# Patient Record
Sex: Male | Born: 1990
Health system: Southern US, Community
[De-identification: ages and names within clinical notes are randomized; demographics above are authoritative.]

---

## 2020-04-05 ENCOUNTER — Other Ambulatory Visit (HOSPITAL_COMMUNITY): Payer: Self-pay | Admitting: Family Medicine

## 2020-04-05 ENCOUNTER — Ambulatory Visit (INDEPENDENT_AMBULATORY_CARE_PROVIDER_SITE_OTHER): Payer: Medicaid Other | Admitting: Family Medicine

## 2020-04-05 ENCOUNTER — Other Ambulatory Visit: Payer: Self-pay

## 2020-04-05 ENCOUNTER — Other Ambulatory Visit (HOSPITAL_COMMUNITY)
Admission: RE | Admit: 2020-04-05 | Discharge: 2020-04-05 | Disposition: A | Discharge status: Home / Self Care | Payer: Medicaid Other | Source: Ambulatory Visit | Attending: Family Medicine | Admitting: Family Medicine

## 2020-04-05 VITALS — BP 110/68 | HR 64 | Ht 70.08 in | Wt 165.2 lb

## 2020-04-05 DIAGNOSIS — B354 Tinea corporis: Secondary | ICD-10-CM | POA: Diagnosis not present

## 2020-04-05 DIAGNOSIS — R7612 Nonspecific reaction to cell mediated immunity measurement of gamma interferon antigen response without active tuberculosis: Secondary | ICD-10-CM | POA: Insufficient documentation

## 2020-04-05 DIAGNOSIS — Z0120 Encounter for dental examination and cleaning without abnormal findings: Secondary | ICD-10-CM | POA: Insufficient documentation

## 2020-04-05 DIAGNOSIS — Z0289 Encounter for other administrative examinations: Secondary | ICD-10-CM

## 2020-04-05 LAB — POCT UA - MICROSCOPIC ONLY

## 2020-04-05 LAB — POCT URINALYSIS DIP (MANUAL ENTRY)
Bilirubin, UA: NEGATIVE
Blood, UA: NEGATIVE
Glucose, UA: NEGATIVE mg/dL
Ketones, POC UA: NEGATIVE mg/dL
Leukocytes, UA: NEGATIVE
Nitrite, UA: NEGATIVE
Protein Ur, POC: NEGATIVE mg/dL
Spec Grav, UA: 1.025 (ref 1.010–1.025)
Urobilinogen, UA: 0.2 E.U./dL
pH, UA: 7 (ref 5.0–8.0)

## 2020-04-05 MED ORDER — IVERMECTIN 3 MG PO TABS: 200.0000 ug/kg | ORAL_TABLET | Freq: Every day | ORAL | 0 refills | Status: AC

## 2020-04-05 MED ORDER — KETOCONAZOLE 2 % EX CREA: 1.0000 "application " | TOPICAL_CREAM | Freq: Every day | CUTANEOUS | 0 refills | Status: DC

## 2020-04-05 MED ORDER — ALBENDAZOLE 200 MG PO TABS: 400.0000 mg | ORAL_TABLET | Freq: Once | ORAL | 0 refills | Status: AC

## 2020-04-05 MED FILL — ALBENDAZOLE 200 MG TABS: 200 | 1 days supply | Qty: 2 | Fill #0

## 2020-04-05 MED FILL — KETOCONAZOLE 2% CREAM: 2 | 7 days supply | Qty: 15 | Fill #0

## 2020-04-05 MED FILL — IVERMECTIN 3 MG TABLET: 3 | 2 days supply | Qty: 10 | Fill #0

## 2020-04-05 NOTE — Assessment & Plan Note (Signed)
Provided list of dentists currently accepting Medicaid and new patients.
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
## 2020-04-05 NOTE — Patient Instructions (Addendum)
Unfortunately, we do not yet have written translation services here. I have translated this paperwork into your language using Google translate. There may be some errors.   It was wonderful to meet you today. Thank you for allowing me to be a part of your care. Below is a short summary of what we discussed at your visit today:     ?      ??   .         ?? ??    ?? ?. ??  ??? ?? .   ?? ?  ?  ?   ?.  ?  ? ?   ?  . ?   ? ?? ? ? ?    ? ?  ?:  Establishing as a patient at the [facility] Today we reviewed all of your health history, including past medical conditions, medications, past surgeries, and allergies.  We also reviewed your vaccine status and necessary screenings, those are discussed below.    ? ?  ?? ?    ? ?   ? ?  ? ??? ? ? ?    ?  ?    ? ?  ?. ?   ? ?  ?? ???  ???  ?  .  Blood work Today we obtained blood samples from you for routine immigration blood tests. We will test for certain infectious diseases along with evaluating your cholesterol, thyroid function, and other health parameters. If the results are normal or negative, we will send you a letter. If any tests are positive or abnormal, we will call you with an interpreter to let you know the results and plan.     ??   ? ?   ??  ?? ??   ?  ?? ?  ??. ?    ??  ??? ?   ??? ???    ???  ?  ? ?.  ??  ?   ?    ? ? .  ? ?? ? ? ?   ?     ?  ?  ?     ?    ?  ?.  Screenings PAP Smear: This test is recommended for all adult women to screen for cervical cancer. I have included a handout on what is involved with this exam. When you are ready for this screening test, please call the office and schedule it. This is not urgent and may be scheduled at your convenience.   Mammograms: As you are 30 y.o., you do not yet need a mammogram to screen for breast cancer. These will start when you are 30 years old.   Colonoscopy: As you are 30 y.o. and without family history of colon cancer, you will need to start colon cancer screenings with colonoscopies at 30 years of age.    ??? PAP Smear:  ?  ?  ?  ?? ?? ?      ? ?.  ?   ?? ? ? ? ? ? ? ? .  ?   ? ??? ??   ?  ?   ?   ? ?? ?.  ???       ?  ? .  ??:  ?? ?  29  ?    ??    ?  ??  ?? .     ?   ?  46  ?.  :  ?? ?  29  ?.      ? ?   ??  ?  56 ? ?       ? ? ?.  If you have any questions or concerns, please do not hesitate to contact us via phone or MyChart message.       ? ? ??   ?  ? ? MyChart ?  ? ?  ?? ?.  Fayette Pho, MD

## 2020-04-07 LAB — URINE CYTOLOGY ANCILLARY ONLY
Chlamydia: NEGATIVE
Comment: NEGATIVE
Comment: NORMAL
Neisseria Gonorrhea: NEGATIVE

## 2020-04-09 LAB — CBC WITH DIFFERENTIAL/PLATELET
Basophils Absolute: 0 10*3/uL (ref 0.0–0.2)
Basos: 1 %
EOS (ABSOLUTE): 0.1 10*3/uL (ref 0.0–0.4)
Eos: 2 %
Hematocrit: 44.6 % (ref 37.5–51.0)
Hemoglobin: 15.2 g/dL (ref 13.0–17.7)
Immature Grans (Abs): 0 10*3/uL (ref 0.0–0.1)
Immature Granulocytes: 1 %
Lymphocytes Absolute: 2.4 10*3/uL (ref 0.7–3.1)
Lymphs: 36 %
MCH: 30.2 pg (ref 26.6–33.0)
MCHC: 34.1 g/dL (ref 31.5–35.7)
MCV: 89 fL (ref 79–97)
Monocytes Absolute: 0.5 10*3/uL (ref 0.1–0.9)
Monocytes: 8 %
Neutrophils Absolute: 3.6 10*3/uL (ref 1.4–7.0)
Neutrophils: 52 %
Platelets: 290 10*3/uL (ref 150–450)
RBC: 5.03 x10E6/uL (ref 4.14–5.80)
RDW: 11.8 % (ref 11.6–15.4)
WBC: 6.6 10*3/uL (ref 3.4–10.8)

## 2020-04-09 LAB — COMPREHENSIVE METABOLIC PANEL
ALT: 30 IU/L (ref 0–44)
AST: 22 IU/L (ref 0–40)
Albumin/Globulin Ratio: 1.7 (ref 1.2–2.2)
Albumin: 4.8 g/dL (ref 4.1–5.2)
Alkaline Phosphatase: 51 IU/L (ref 44–121)
BUN/Creatinine Ratio: 17 (ref 9–20)
BUN: 17 mg/dL (ref 6–20)
Bilirubin Total: 0.5 mg/dL (ref 0.0–1.2)
CO2: 24 mmol/L (ref 20–29)
Calcium: 9.8 mg/dL (ref 8.7–10.2)
Chloride: 101 mmol/L (ref 96–106)
Creatinine, Ser: 1.01 mg/dL (ref 0.76–1.27)
GFR calc Af Amer: 116 mL/min/{1.73_m2} (ref >59)
GFR calc non Af Amer: 101 mL/min/{1.73_m2} (ref >59)
Globulin, Total: 2.8 g/dL (ref 1.5–4.5)
Glucose: 91 mg/dL (ref 65–99)
Potassium: 4.5 mmol/L (ref 3.5–5.2)
Sodium: 142 mmol/L (ref 134–144)
Total Protein: 7.6 g/dL (ref 6.0–8.5)

## 2020-04-09 LAB — LIPID PANEL
Chol/HDL Ratio: 4.2 ratio (ref 0.0–5.0)
Cholesterol, Total: 150 mg/dL (ref 100–199)
HDL: 36 mg/dL — ABNORMAL LOW (ref >39)
LDL Chol Calc (NIH): 97 mg/dL (ref 0–99)
Triglycerides: 87 mg/dL (ref 0–149)
VLDL Cholesterol Cal: 17 mg/dL (ref 5–40)

## 2020-04-09 LAB — RPR: RPR Ser Ql: NONREACTIVE

## 2020-04-09 LAB — HEPATITIS B SURFACE ANTIBODY, QUANTITATIVE: Hepatitis B Surf Ab Quant: 89.5 m[IU]/mL (ref >9.9)

## 2020-04-09 LAB — HEPATITIS B CORE ANTIBODY, TOTAL: Hep B Core Total Ab: NEGATIVE

## 2020-04-09 LAB — HCV AB W REFLEX TO QUANT PCR: HCV Ab: <0.1 s/co ratio (ref 0.0–0.9)

## 2020-04-09 LAB — HEPATITIS B SURFACE ANTIGEN: Hepatitis B Surface Ag: NEGATIVE

## 2020-04-09 LAB — HCV INTERPRETATION

## 2020-04-09 LAB — TSH: TSH: 2.09 u[IU]/mL (ref 0.450–4.500)

## 2020-04-09 LAB — VARICELLA ZOSTER ANTIBODY, IGG: Varicella zoster IgG: 499 index (ref >165)

## 2020-04-09 LAB — HIV ANTIBODY (ROUTINE TESTING W REFLEX): HIV Screen 4th Generation wRfx: NONREACTIVE

## 2020-04-11 ENCOUNTER — Encounter: Payer: Self-pay | Admitting: Family Medicine

## 2020-04-29 ENCOUNTER — Ambulatory Visit (INDEPENDENT_AMBULATORY_CARE_PROVIDER_SITE_OTHER): Payer: Medicaid Other | Admitting: Family Medicine

## 2020-04-29 ENCOUNTER — Other Ambulatory Visit (HOSPITAL_COMMUNITY): Payer: Self-pay | Admitting: Family Medicine

## 2020-04-29 ENCOUNTER — Other Ambulatory Visit: Payer: Self-pay

## 2020-04-29 ENCOUNTER — Ambulatory Visit
Admission: RE | Admit: 2020-04-29 | Discharge: 2020-04-29 | Disposition: A | Discharge status: Home / Self Care | Payer: Medicaid Other | Source: Ambulatory Visit | Attending: Family Medicine | Admitting: Family Medicine

## 2020-04-29 ENCOUNTER — Other Ambulatory Visit: Payer: Self-pay | Admitting: Family Medicine

## 2020-04-29 VITALS — BP 110/65 | HR 62 | Ht 69.17 in | Wt 168.8 lb

## 2020-04-29 DIAGNOSIS — R7612 Nonspecific reaction to cell mediated immunity measurement of gamma interferon antigen response without active tuberculosis: Secondary | ICD-10-CM

## 2020-04-29 DIAGNOSIS — B354 Tinea corporis: Secondary | ICD-10-CM

## 2020-04-29 DIAGNOSIS — Z0000 Encounter for general adult medical examination without abnormal findings: Secondary | ICD-10-CM

## 2020-04-29 MED ORDER — TERBINAFINE HCL 1 % EX CREA
1.0000 | TOPICAL_CREAM | Freq: Two times a day (BID) | CUTANEOUS | 0 refills | Status: DC
Start: 2020-04-29 — End: 2022-03-23

## 2020-04-29 MED FILL — TERBINAFINE 1% CREAM: 1 | 15 days supply | Qty: 30 | Fill #0

## 2020-04-29 NOTE — Patient Instructions (Signed)
It was wonderful to see you today. Thank you for allowing me to be a part of your care. Below is a short summary of what we discussed at your visit today:   ? ?  ?  ?  ?.  ?    ~? ?   ?  . ?   ? ?? ? ? ?    ? ~?  ~?:  Positive TB (tuberculosis) screening test   ?  ? We will need to obtain a chest x-ray to make sure you do not have an active tuberculosis infection. You will go to the [facility] for a chest x-ray.  I have included a separate letter with walking instructions.  ?   ?? ?~ ?  ~  ??  ? ??  ~? ?    ?   .    ?? ?~ ?   ?? ? ~  ? .   ? ?  ?  ?~  ~?? ?.  Leg rash ? ? Unfortunately, we do not have the Mycogen-B available in the [place].  I will switch you from ketoconazole cream to terbinafine cream.  Please use this as instructed for 6 weeks.  If it does not improve after 6 weeks, please stop using the cream and come back 2 weeks after stopping the cream.  At that time, we will get a skin sample to confirm what exactly is causing the rash.    ?   ? ~? Mycogen-B  .     ~??~ ~? ? ?? ~?  ?.  ~?   6 ?   ??  ? ~. ~   6 ?  ?    ~?  ~? ~  ~?   ~?   ?  ? .  ?  ~? ?   ~   ~? ?  ?? ~? ?  ??? ?    ~??.  If you have any questions or concerns, please do not hesitate to contact us via phone or MyChart message.  ~  ~ ? ? ??   ~?  ? ? MyChart ?  ? ?  ??~ ?.  Fayette Pho, MD

## 2020-04-30 ENCOUNTER — Encounter: Payer: Self-pay | Admitting: Family Medicine

## 2020-04-30 DIAGNOSIS — B354 Tinea corporis: Secondary | ICD-10-CM | POA: Insufficient documentation

## 2020-04-30 NOTE — Assessment & Plan Note (Addendum)
Previously treated with ketoconazole cream x 1 month without improvement.  - d/c ketoconazole cream - start terbinafine cream x 6 weeks - if no improvement after 6 weeks, discontinue and seek derm clinic appt for diagnostic skin scraping

## 2020-04-30 NOTE — Assessment & Plan Note (Signed)
Denies symptoms of active infection. Send for chest XR.

## 2020-04-30 NOTE — Progress Notes (Signed)
    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 Patient denies fever, chills, night sweats, cough, hemoptysis, weight loss. 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 No other known sick contacts such as household members.   Tinea corporis leg lesion Leg rash persistent despite treatment with ketoconazole cream since last appointment. Patient reports he gets this rash every year and treats is with Micogen-B cream. If he does not treat with this cream, it will spread. Patient presents photo of cream he previously used to treat this rash - "Micogen-B" which contains miconazole, gentamycin, and betamethasone. Wonders if we can prescribe this.   PERTINENT  PMH / PSH: Tinea corporis of ankle  OBJECTIVE:   BP 110/65   Pulse 62   Ht 5' 9.17" (1.757 m)   Wt 168 lb 12.8 oz (76.6 kg)   SpO2 99%   BMI 24.80 kg/m   PHQ-9:  Depression screen [facility] 2/9 04/29/2020 04/05/2020  Decreased Interest 0 1  Down, Depressed, Hopeless 0 1  PHQ - 2 Score 0 2  Altered sleeping 0 0  Tired, decreased energy 0 0  Change in appetite 0 0  Feeling bad or failure about yourself  0 0  Trouble concentrating 0 0  Moving slowly or fidgety/restless 0 0  Suicidal thoughts 0 0  PHQ-9 Score 0 2     GAD-7: No flowsheet data found.   Physical Exam General: Awake, alert, oriented, no acute distress Respiratory: Unlabored respirations, speaking in full sentences, no respiratory distress Extremities: Moving all extremities spontaneously Neuro: Cranial nerves II through X grossly intact Psych: Normal insight and judgement   ASSESSMENT/PLAN:   Tinea corporis Previously treated with ketoconazole cream x 1 month without improvement.  - d/c ketoconazole cream - start terbinafine cream x 6 weeks - if no improvement after 6 weeks, discontinue and seek  derm clinic appt for diagnostic skin scraping  Positive QuantiFERON-TB Gold test Denies symptoms of active infection. Send for chest XR.      Fayette Pho, MD [facility]

## 2020-05-02 ENCOUNTER — Encounter: Payer: Self-pay | Admitting: Family Medicine

## 2021-04-03 ENCOUNTER — Other Ambulatory Visit (HOSPITAL_COMMUNITY): Payer: Self-pay

## 2021-04-03 MED ORDER — AMOXICILLIN 500 MG PO CAPS
500.0000 mg | ORAL_CAPSULE | Freq: Three times a day (TID) | ORAL | 0 refills | Status: DC
  Filled 2021-04-03: qty 21, 7d supply, fill #0

## 2021-04-03 MED ORDER — CHLORHEXIDINE GLUCONATE 0.12 % MT SOLN
Freq: Two times a day (BID) | OROMUCOSAL | 0 refills | Status: AC
  Filled 2021-04-03: qty 473, 16d supply, fill #0

## 2021-04-03 MED ORDER — IBUPROFEN 800 MG PO TABS
800.0000 mg | ORAL_TABLET | Freq: Three times a day (TID) | ORAL | 0 refills | Status: AC | PRN
  Filled 2021-04-03: qty 20, 7d supply, fill #0

## 2021-04-04 ENCOUNTER — Other Ambulatory Visit (HOSPITAL_COMMUNITY): Payer: Self-pay

## 2021-05-01 NOTE — Progress Notes (Signed)
° ° °  SUBJECTIVE:   CHIEF COMPLAINT / HPI:   Physical for work - due for flu and COVID vaccines - needs physical for work - currently a Location manager at a plant, one hour commute - does have lots of dust and fibers in air at work, does wear respirator - reviewed comprehensive labs from 2022 - patient without concerns today  PERTINENT  PMH / PSH:  Patient Active Problem List   Diagnosis Date Noted   Annual physical exam 05/03/2021   Tinea corporis 04/30/2020   Encounter for health examination of refugee 04/05/2020   Positive QuantiFERON-TB Gold test 04/05/2020   Need for assessment by dentistry for poor dentition 04/05/2020    OBJECTIVE:   BP 109/75    Pulse (!) 53    Ht 5' 9.17" (1.757 m)    Wt 165 lb 9.6 oz (75.1 kg)    SpO2 100%    BMI 24.33 kg/m    PHQ-9:  Depression screen [facility] 2/9 05/02/2021 04/29/2020 04/05/2020  Decreased Interest 0 0 1  Down, Depressed, Hopeless 0 0 1  PHQ - 2 Score 0 0 2  Altered sleeping 0 0 0  Tired, decreased energy 0 0 0  Change in appetite 0 0 0  Feeling bad or failure about yourself  0 0 0  Trouble concentrating 0 0 0  Moving slowly or fidgety/restless 0 0 0  Suicidal thoughts 0 0 0  PHQ-9 Score 0 0 2     GAD-7: No flowsheet data found.  Physical Exam General: Awake, alert, oriented HEENT: PERRL, bilateral TM pearly pink and flat, bilateral external auditory canals with minimal cerumen burden, no lesions, nasal mucosa slightly edematous, oral mucosa pink, moist, without lesion, intact dentition without obvious cavity Lymph: No palpable lymphedema of head or neck Cardiovascular: Regular rate and rhythm, S1 and S2 present, no murmurs auscultated Respiratory: Lung fields clear to auscultation bilaterally Abdomen: Soft, nondistended, no TTP in any quadrant, no rebound tenderness or guarding Extremities: No bilateral lower extremity edema, palpable pedal and pretibial pulses bilaterally Neuro: Cranial nerves II through X grossly intact,  able to move all extremities spontaneously  ASSESSMENT/PLAN:   Annual physical exam Needs complete physical exam for work. Patient presents work form to be completed stating he obtained the physical exam. No concerns today. All previous labs normal, no indication for additional testing today. Vitals normal, physical exam unremarkable. Cleared to work. Did receive flu and COVID booster today. Provided work note. See AVS for more.      Fayette Pho, MD [facility]

## 2021-05-02 ENCOUNTER — Ambulatory Visit (INDEPENDENT_AMBULATORY_CARE_PROVIDER_SITE_OTHER): Payer: Medicaid Other | Admitting: Family Medicine

## 2021-05-02 ENCOUNTER — Encounter: Payer: Self-pay | Admitting: Family Medicine

## 2021-05-02 ENCOUNTER — Ambulatory Visit (INDEPENDENT_AMBULATORY_CARE_PROVIDER_SITE_OTHER): Payer: Medicaid Other

## 2021-05-02 ENCOUNTER — Other Ambulatory Visit: Payer: Self-pay

## 2021-05-02 VITALS — BP 109/75 | HR 53 | Ht 69.17 in | Wt 165.6 lb

## 2021-05-02 DIAGNOSIS — Z0000 Encounter for general adult medical examination without abnormal findings: Secondary | ICD-10-CM | POA: Diagnosis not present

## 2021-05-02 DIAGNOSIS — Z23 Encounter for immunization: Secondary | ICD-10-CM | POA: Diagnosis present

## 2021-05-02 NOTE — Patient Instructions (Addendum)
It was wonderful to see you today. Thank you for allowing me to be a part of your care. Below is a short summary of what we discussed at your visit today:  Physical for work You are very healthy! Your vitals are excellent. I have no concerns. I have completed your work form. Please let us know if you have questions or concerns.   Vaccines Today you received the annual flu vaccine and bivalent Covid booster. You may experience some residual soreness at the injection site.  Gentle stretches and regular use of that arm will help speed up your recovery.  As the vaccines are giving your immune system a "practice run" against specific infections, you may feel a little under the weather for the next several days.  We recommend rest as needed and hydrating.  Cooking and Nutrition Classes [employer] in [place] provides many classes at low or no cost to [employer], nutrition, and agriculture.  Their website offers a huge variety of information related to topics such as gardening, nutrition, cooking, parenting, and health.  Also listed are classes and events, both online and in-person.  Check out their website here: [email or web address]    Please bring all of your medications to every appointment!  If you have any questions or concerns, please do not hesitate to contact us via phone or MyChart message.   Fayette Pho, MD

## 2021-05-03 DIAGNOSIS — Z0000 Encounter for general adult medical examination without abnormal findings: Secondary | ICD-10-CM | POA: Insufficient documentation

## 2021-05-03 NOTE — Assessment & Plan Note (Addendum)
Needs complete physical exam for work. Patient presents work form to be completed stating he obtained the physical exam. No concerns today. All previous labs normal, no indication for additional testing today. Vitals normal, physical exam unremarkable. Cleared to work. Did receive flu and COVID booster today. Provided work note. See AVS for more.

## 2021-05-19 ENCOUNTER — Other Ambulatory Visit (HOSPITAL_COMMUNITY): Payer: Self-pay

## 2021-10-03 ENCOUNTER — Other Ambulatory Visit (HOSPITAL_COMMUNITY): Payer: Self-pay

## 2021-10-26 ENCOUNTER — Other Ambulatory Visit (HOSPITAL_COMMUNITY): Payer: Self-pay

## 2021-10-26 MED ORDER — IBUPROFEN 800 MG PO TABS
800.0000 mg | ORAL_TABLET | Freq: Three times a day (TID) | ORAL | 1 refills | Status: DC | PRN
  Filled 2021-10-26: qty 20, 7d supply, fill #0

## 2021-10-26 MED ORDER — AMOXICILLIN 500 MG PO CAPS
500.0000 mg | ORAL_CAPSULE | Freq: Three times a day (TID) | ORAL | 0 refills | Status: DC
  Filled 2021-10-26: qty 21, 7d supply, fill #0

## 2021-10-26 MED ORDER — CHLORHEXIDINE GLUCONATE 0.12 % MT SOLN
15.0000 mL | Freq: Two times a day (BID) | OROMUCOSAL | 0 refills | Status: DC
  Filled 2021-10-26: qty 473, 10d supply, fill #0

## 2022-02-19 ENCOUNTER — Other Ambulatory Visit (HOSPITAL_COMMUNITY): Payer: Self-pay

## 2022-03-23 ENCOUNTER — Encounter: Payer: Self-pay | Admitting: Family Medicine

## 2022-03-23 ENCOUNTER — Ambulatory Visit (INDEPENDENT_AMBULATORY_CARE_PROVIDER_SITE_OTHER): Payer: Medicaid Other | Admitting: Family Medicine

## 2022-03-23 ENCOUNTER — Other Ambulatory Visit (HOSPITAL_COMMUNITY): Payer: Self-pay

## 2022-03-23 VITALS — BP 118/72 | HR 52 | Ht 69.0 in | Wt 164.0 lb

## 2022-03-23 DIAGNOSIS — Z0000 Encounter for general adult medical examination without abnormal findings: Secondary | ICD-10-CM | POA: Diagnosis present

## 2022-03-23 DIAGNOSIS — Z23 Encounter for immunization: Secondary | ICD-10-CM | POA: Diagnosis not present

## 2022-03-23 DIAGNOSIS — K649 Unspecified hemorrhoids: Secondary | ICD-10-CM

## 2022-03-23 MED ORDER — PHENYLEPHRINE-COCOA BUTTER 0.25-88.44 % RE SUPP
1.0000 | RECTAL | 2 refills | Status: AC | PRN
  Filled 2022-03-23: qty 48, fill #0

## 2022-03-23 MED ORDER — PHENYLEPH-SHARK LIV OIL-MO-PET 0.25-3-14-71.9 % RE OINT
1.0000 | TOPICAL_OINTMENT | Freq: Two times a day (BID) | RECTAL | 0 refills | Status: AC | PRN
  Filled 2022-03-23: qty 30, fill #0

## 2022-03-23 NOTE — Patient Instructions (Addendum)
I have translated the following text using Google translate.  As such, there are many errors.  I apologize for the poor written translation; however, we do not have written  translation services yet. Google does not offer Dari but does offer Pashto. It was wonderful to see you today. Thank you for allowing me to be a part of your care. Below is a short summary of what we discussed at your visit today: ?? ????? ??? ? ???? ????? ?? ?????? ??? ?????? ??. ?? ?? ????? ???? ???? ???? ???. ?? ? ???? ???? ??? ????? ????? ????? ?????? ?? ??????? ??? ?? ???? ? ????? ????? ??????? ?? ???. ???? ??? ?? ?????? ??? ?? ???? ?????? ???. ?? ?? ??? ???? ?? ?? ?? ??? ???? ?????. ???? ?? ????? ????? ?? ????? ? ??????? ???? ??. ????? ? ??? ?? ????? ?? ?? ??? ?? ????? ?? ????? ?? ??? ???:  Annual physical Today we obtained blood work to check in on your electrolytes, kidney function, red blood cells, and cholesterol. If the results are normal, I will send you a letter or MyChart message. If the results are abnormal, I will give you a call.   ???? ????? ?? ??? ??? ????? ? ???????????? ? ??????? ?????? ? ? ???? ??? ???? ?? ????????? ??? ???? ????? ? ???? ??? ?????? ??. ?? ????? ???? ??? ?? ?? ???? ?? ?? ??? ?? ? MyChart ????? ??????. ?? ????? ??? ?????? ??? ?? ?? ???? ?? ?????? ?????.  Hemorrhoids I have sent in prescriptions for a cream and a suppository.  Please use as needed to help. If you do not find relief within about a week, please call us back and we can try different medicine. If they get much worse, we can send you to a colorectal surgeon to do a outpatient procedure involving clipping or other destruction of the hemorrhoid. ?????? ?? ? ???? ?? ????????? ???? ????? ??. ??????? ???? ? ????? ????? ? ????? ?? ???? ?? ??????. ?? ???? ? ??? ???? ?? ????? ?? ???? ??? ????? ??????? ???? ??? ?? ??? ???? ?? ??? ???? ?? ????? ???? ??? ????. ?? ???? ??? ??? ???? ??? ??? ???? ?? ???? ? ????????? ???? ?? ?????? ???? ? ???? ????  ??????? ????? ??? ?? ? ?????? ???? ??? ?? ??? ??????? ??? ???? ??.   If you have any questions or concerns, please do not hesitate to contact us via phone or MyChart message.  ?? ???? ???? ?????? ?? ??????? ???? ??????? ???? ? ?????? ?? MyChart ????? ?? ???? ??? ??? ????? ?????.  Ezequiel Essex, MD

## 2022-03-23 NOTE — Progress Notes (Unsigned)
   SUBJECTIVE:   CHIEF COMPLAINT / HPI:   Annual physical Reviewed meds, allergies, history. No changes to health at this time. No paperwork required for work this appointment.   Hemorrhoid concerns Previously had problems with hemmorrhoid in [place]. Was taking PO Doxium with good relief - doxium (calcium doisylate).   For last two weeks, has been seeing blood in toilet after stools. Happens every BM. Clearly on stool or after stool, not mixed in with stool. No hemoptysis or hematuria.   Patient wonders if this hemorrhoid recurrence if from standing long hours at work. Denies constipation, hard stools, or needing to strain with BM. Denies feeling faint or light headed. No personal or family history of colon cancer.   PERTINENT  PMH / PSH: Tinea corporis, refugee status  OBJECTIVE:   BP 118/72   Pulse (!) 52   Ht 5\' 9"  (1.753 m)   Wt 164 lb (74.4 kg)   SpO2 98%   BMI 24.22 kg/m    PHQ-9:     03/23/2022    1:55 PM 05/02/2021    3:56 PM 04/29/2020    2:55 PM  Depression screen PHQ 2/9  Decreased Interest 0 0 0  Down, Depressed, Hopeless 0 0 0  PHQ - 2 Score 0 0 0  Altered sleeping  0 0  Tired, decreased energy  0 0  Change in appetite  0 0  Feeling bad or failure about yourself   0 0  Trouble concentrating  0 0  Moving slowly or fidgety/restless  0 0  Suicidal thoughts  0 0  PHQ-9 Score  0 0    Physical Exam General: Awake, alert, oriented Cardiovascular: Regular rate and rhythm, S1 and S2 present, no murmurs auscultated Respiratory: Lung fields clear to auscultation bilaterally  ASSESSMENT/PLAN:   Annual physical exam No changes to heath history since last visit. Chart updated. Will obtain CBC, BMP, and lipids. Flu vaccine today without adverse side effects. Will obtain COVID at later date, does not want two vaccines today.   Hemorrhoids 2 weeks duration, recurrence of hemorrhoids. Known history. Denies constipation or straining with stools. Previously on PO  Doxium in [place] with good relief. Will trial preparation H cream and suppositories, rx sent to pharmacy to ensure he obtains the correct OTC. Return precautions given.     Ezequiel Essex, MD [place]

## 2022-03-24 ENCOUNTER — Encounter: Payer: Self-pay | Admitting: Family Medicine

## 2022-03-24 DIAGNOSIS — K649 Unspecified hemorrhoids: Secondary | ICD-10-CM | POA: Insufficient documentation

## 2022-03-24 LAB — BASIC METABOLIC PANEL
BUN/Creatinine Ratio: 18 (ref 9–20)
BUN: 17 mg/dL (ref 6–20)
CO2: 25 mmol/L (ref 20–29)
Calcium: 9.8 mg/dL (ref 8.7–10.2)
Chloride: 102 mmol/L (ref 96–106)
Creatinine, Ser: 0.95 mg/dL (ref 0.76–1.27)
Glucose: 99 mg/dL (ref 70–99)
Potassium: 4.5 mmol/L (ref 3.5–5.2)
Sodium: 143 mmol/L (ref 134–144)
eGFR: 110 mL/min/{1.73_m2} (ref >59)

## 2022-03-24 LAB — LIPID PANEL
Chol/HDL Ratio: 3.7 ratio (ref 0.0–5.0)
Cholesterol, Total: 123 mg/dL (ref 100–199)
HDL: 33 mg/dL — ABNORMAL LOW (ref >39)
LDL Chol Calc (NIH): 74 mg/dL (ref 0–99)
Triglycerides: 83 mg/dL (ref 0–149)
VLDL Cholesterol Cal: 16 mg/dL (ref 5–40)

## 2022-03-24 LAB — CBC
Hematocrit: 42.9 % (ref 37.5–51.0)
Hemoglobin: 14.4 g/dL (ref 13.0–17.7)
MCH: 29.6 pg (ref 26.6–33.0)
MCHC: 33.6 g/dL (ref 31.5–35.7)
MCV: 88 fL (ref 79–97)
Platelets: 300 10*3/uL (ref 150–450)
RBC: 4.87 x10E6/uL (ref 4.14–5.80)
RDW: 11.8 % (ref 11.6–15.4)
WBC: 5.3 10*3/uL (ref 3.4–10.8)

## 2022-03-24 NOTE — Assessment & Plan Note (Signed)
2 weeks duration, recurrence of hemorrhoids. Known history. Denies constipation or straining with stools. Previously on PO Doxium in [place] with good relief. Will trial preparation H cream and suppositories, rx sent to pharmacy to ensure he obtains the correct OTC. Return precautions given.

## 2022-03-24 NOTE — Assessment & Plan Note (Signed)
No changes to heath history since last visit. Chart updated. Will obtain CBC, BMP, and lipids. Flu vaccine today without adverse side effects. Will obtain COVID at later date, does not want two vaccines today.

## 2022-04-27 IMAGING — CR DG CHEST 2V
2 series · 2 of 2 positions shown · non-contrast
Comparison: None.

CLINICAL DATA: Positive QuantiFERON TB gold test.

EXAM:
CHEST - 2 VIEW

[w chest pa]
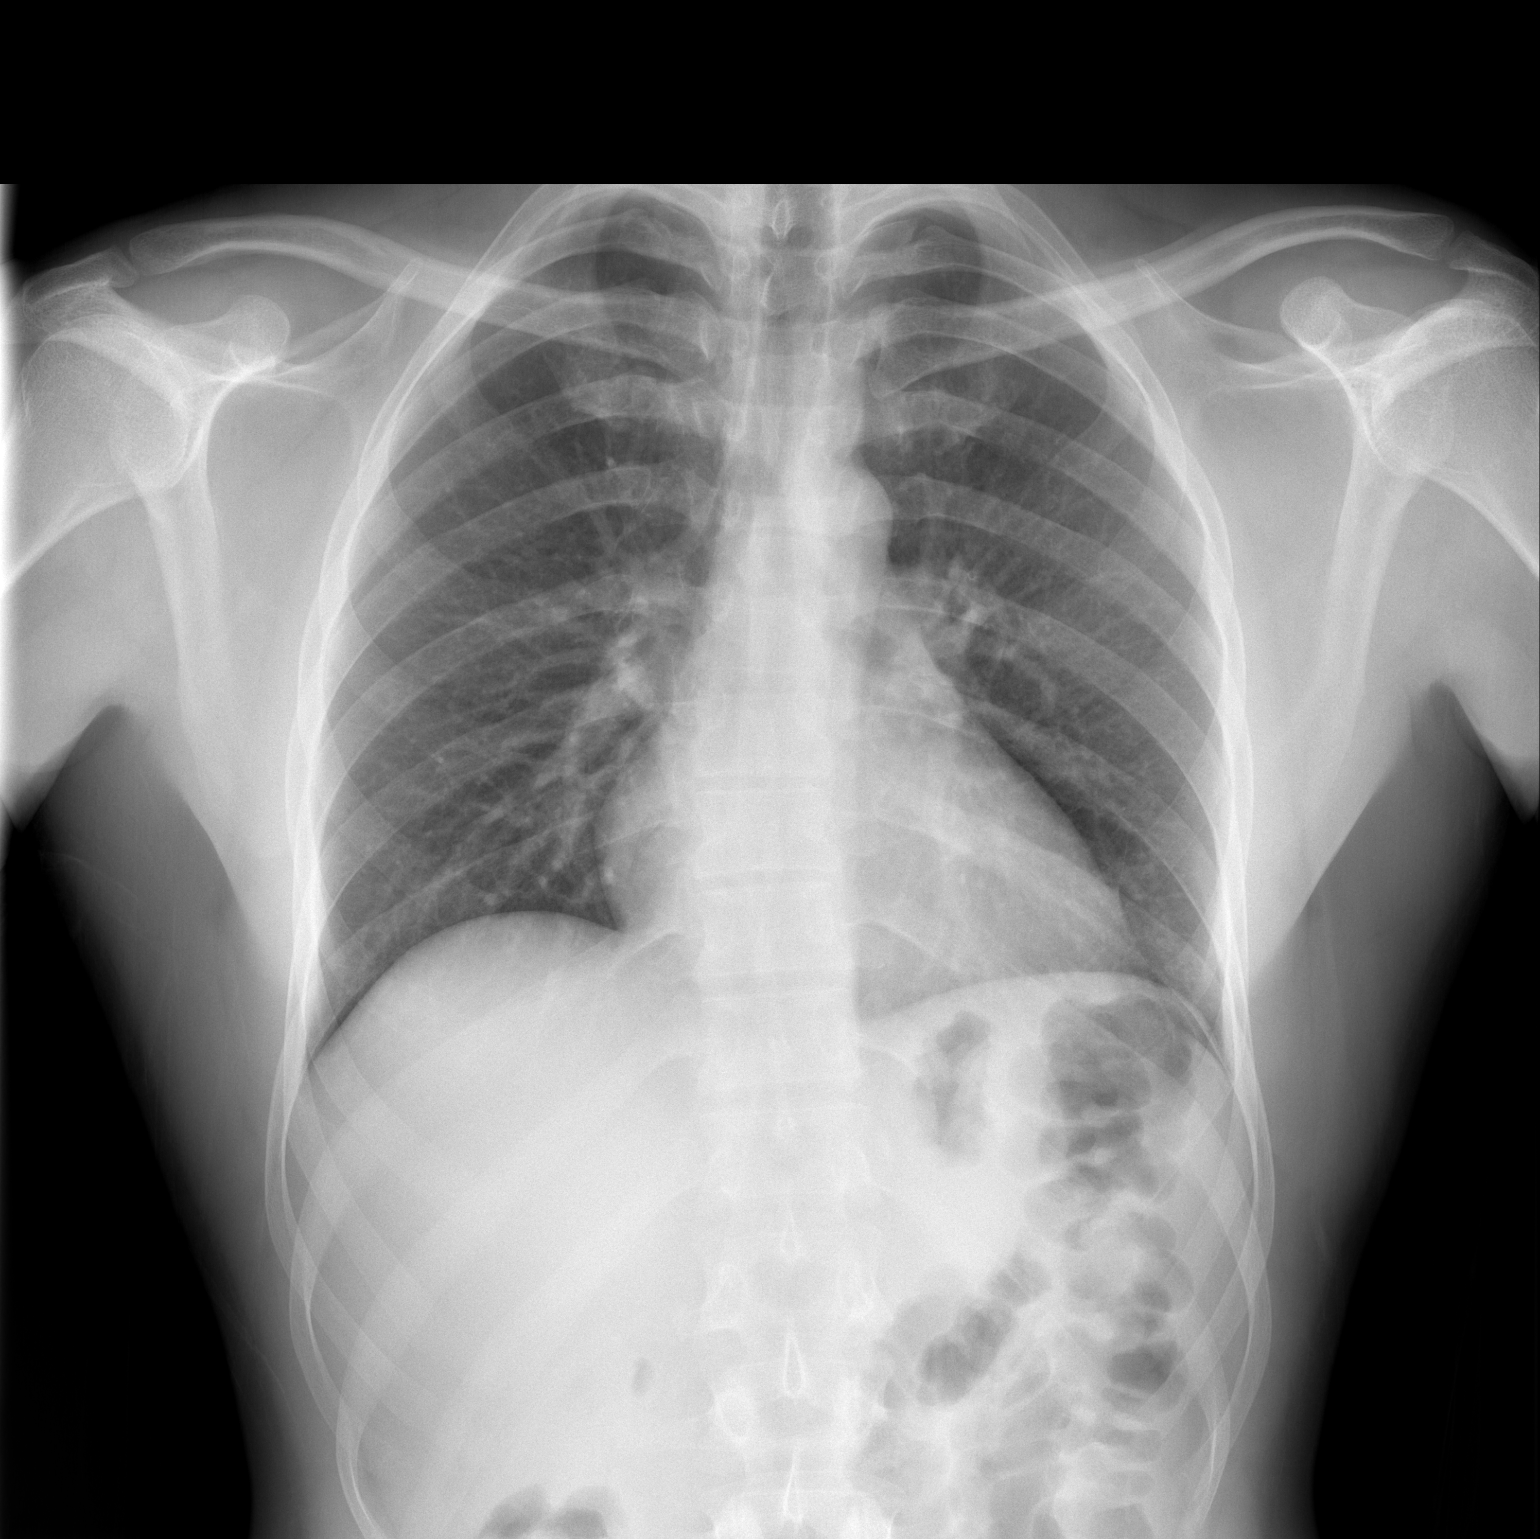

[w chest lat]
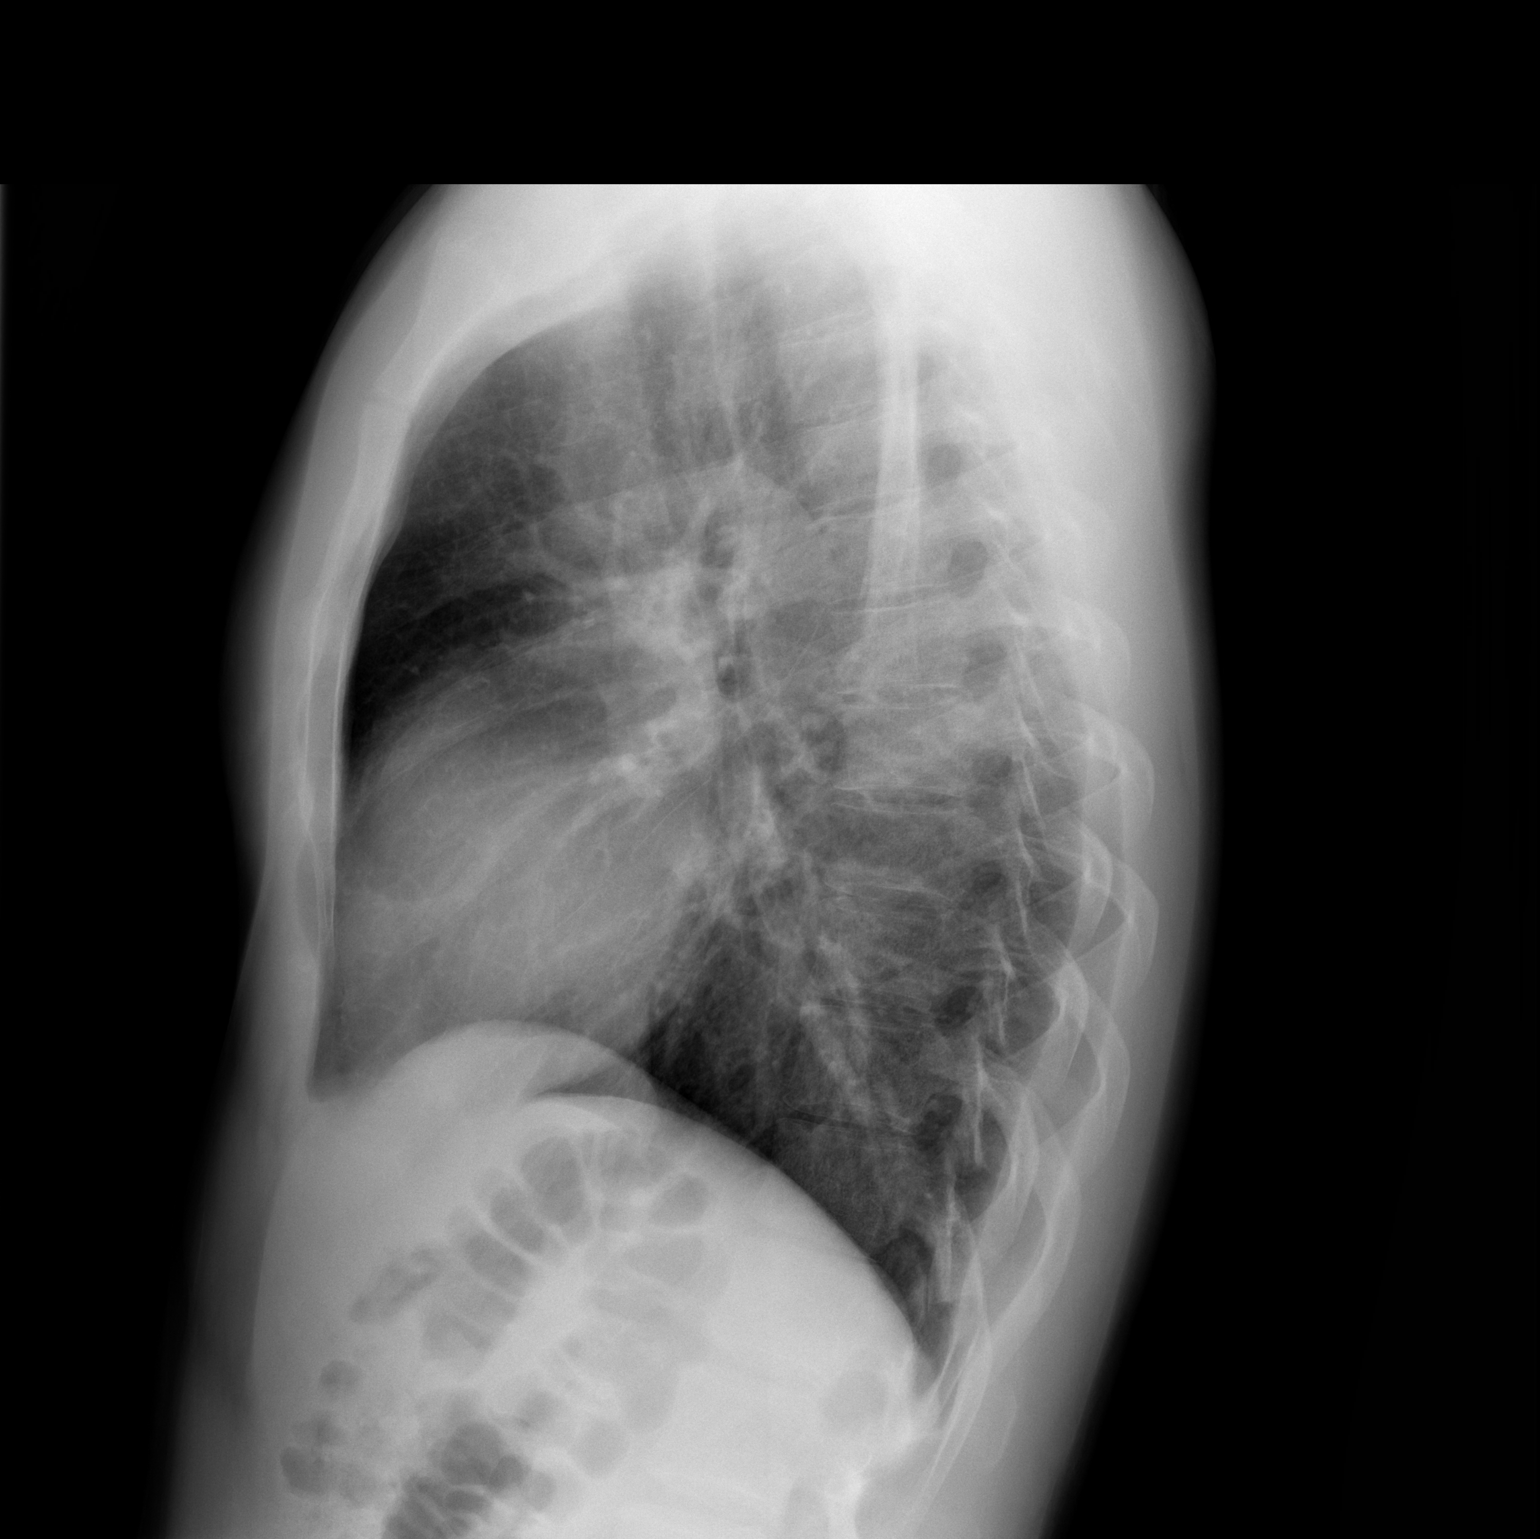

[2 of 2 positions shown; findings below may reference images not displayed]

FINDINGS: Normal heart size. Normal mediastinal contour. No pneumothorax. No
pleural effusion. Lungs appear clear, with no acute consolidative
airspace disease and no pulmonary edema. Visualized osseous
structures appear intact.
IMPRESSION: No active cardiopulmonary disease. No specific radiographic findings
of active pulmonary tuberculosis.

## 2022-10-10 ENCOUNTER — Other Ambulatory Visit (HOSPITAL_COMMUNITY): Payer: Self-pay

## 2022-10-11 ENCOUNTER — Other Ambulatory Visit (HOSPITAL_COMMUNITY): Payer: Self-pay

## 2022-10-11 ENCOUNTER — Other Ambulatory Visit: Payer: Self-pay

## 2022-10-11 MED ORDER — AMOXICILLIN 500 MG PO CAPS
500.0000 mg | ORAL_CAPSULE | Freq: Four times a day (QID) | ORAL | 0 refills | Status: DC
  Filled 2022-10-11: qty 28, 7d supply, fill #0

## 2022-10-11 MED ORDER — IBUPROFEN 800 MG PO TABS
800.0000 mg | ORAL_TABLET | Freq: Three times a day (TID) | ORAL | 0 refills | Status: AC
  Filled 2022-10-11: qty 20, 7d supply, fill #0

## 2022-11-30 ENCOUNTER — Other Ambulatory Visit (HOSPITAL_COMMUNITY): Payer: Self-pay

## 2022-11-30 MED ORDER — AMOXICILLIN 500 MG PO CAPS
500.0000 mg | ORAL_CAPSULE | Freq: Three times a day (TID) | ORAL | 1 refills | Status: AC
Start: 2022-11-30 — End: ?
  Filled 2022-11-30: qty 21, 7d supply, fill #0

## 2022-12-25 ENCOUNTER — Ambulatory Visit: Payer: Medicaid Other

## 2022-12-25 DIAGNOSIS — Z23 Encounter for immunization: Secondary | ICD-10-CM | POA: Diagnosis present

## 2022-12-25 NOTE — Progress Notes (Signed)
Patient presents for [employer].   Vaccines administered today: Hepatitis A, Flu and Covid.   All vaccines tolerated well and without complication.   Advised will forward to PCP to see if any other vaccines are needed.   Advised we have no stock of Polio here at [facility].

## 2022-12-28 ENCOUNTER — Ambulatory Visit: Payer: Medicaid Other
